# Patient Record
Sex: Female | Born: 1990 | Race: Black or African American | Hispanic: No | Marital: Single | Smoking: Never smoker
Health system: Southern US, Community
[De-identification: ages and names within clinical notes are randomized; demographics above are authoritative.]

## PROBLEM LIST (undated history)

## (undated) DIAGNOSIS — B009 Herpesviral infection, unspecified: Secondary | ICD-10-CM

## (undated) DIAGNOSIS — J45909 Unspecified asthma, uncomplicated: Secondary | ICD-10-CM

## (undated) DIAGNOSIS — G43909 Migraine, unspecified, not intractable, without status migrainosus: Secondary | ICD-10-CM

## (undated) DIAGNOSIS — F32A Depression, unspecified: Secondary | ICD-10-CM

## (undated) DIAGNOSIS — M549 Dorsalgia, unspecified: Secondary | ICD-10-CM

## (undated) DIAGNOSIS — T7840XA Allergy, unspecified, initial encounter: Secondary | ICD-10-CM

## (undated) DIAGNOSIS — A6000 Herpesviral infection of urogenital system, unspecified: Secondary | ICD-10-CM

## (undated) HISTORY — DX: Depression, unspecified: F32.A

## (undated) HISTORY — DX: Dorsalgia, unspecified: M54.9

## (undated) HISTORY — DX: Herpesviral infection of urogenital system, unspecified: A60.00

## (undated) HISTORY — DX: Migraine, unspecified, not intractable, without status migrainosus: G43.909

## (undated) HISTORY — DX: Herpesviral infection, unspecified: B00.9

## (undated) HISTORY — DX: Unspecified asthma, uncomplicated: J45.909

## (undated) HISTORY — DX: Allergy, unspecified, initial encounter: T78.40XA

---
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
## 2013-05-18 IMAGING — CR DG FOOT COMPLETE 3+V*L*
3 series · 3 of 3 positions shown · non-contrast
Comparison: None.

CLINICAL DATA: Lateral foot pain after injury.

LEFT FOOT - COMPLETE 3+ VIEW

[t foot ap left]
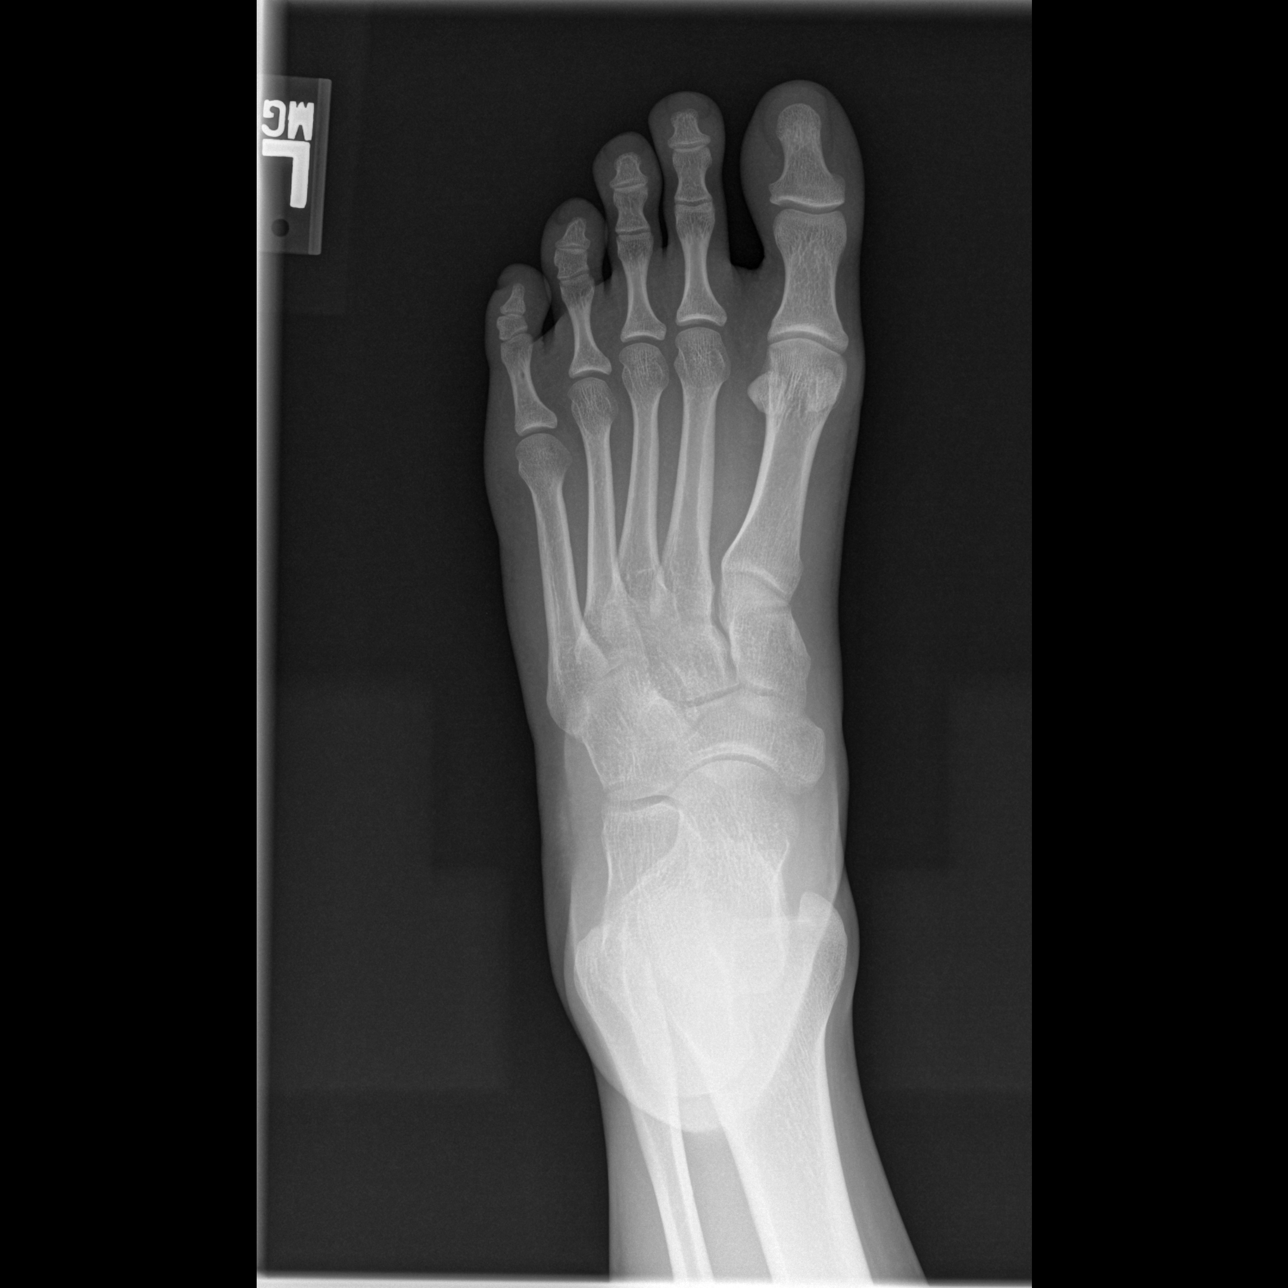

[t foot oblique left]
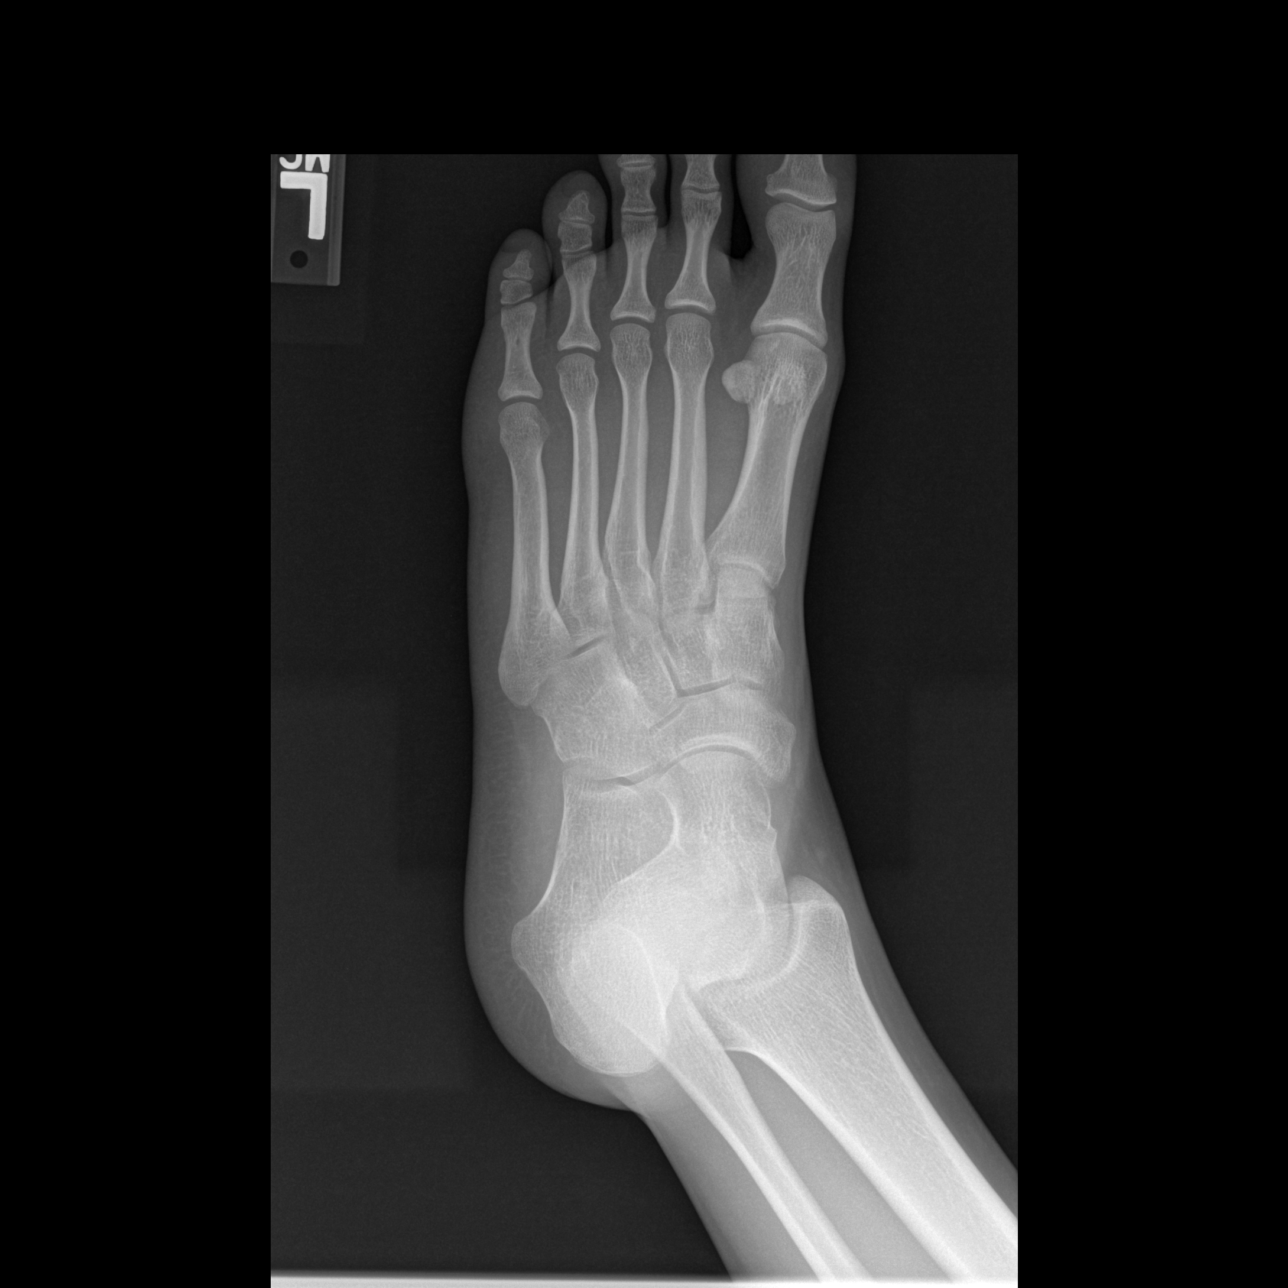

[t foot lat left]
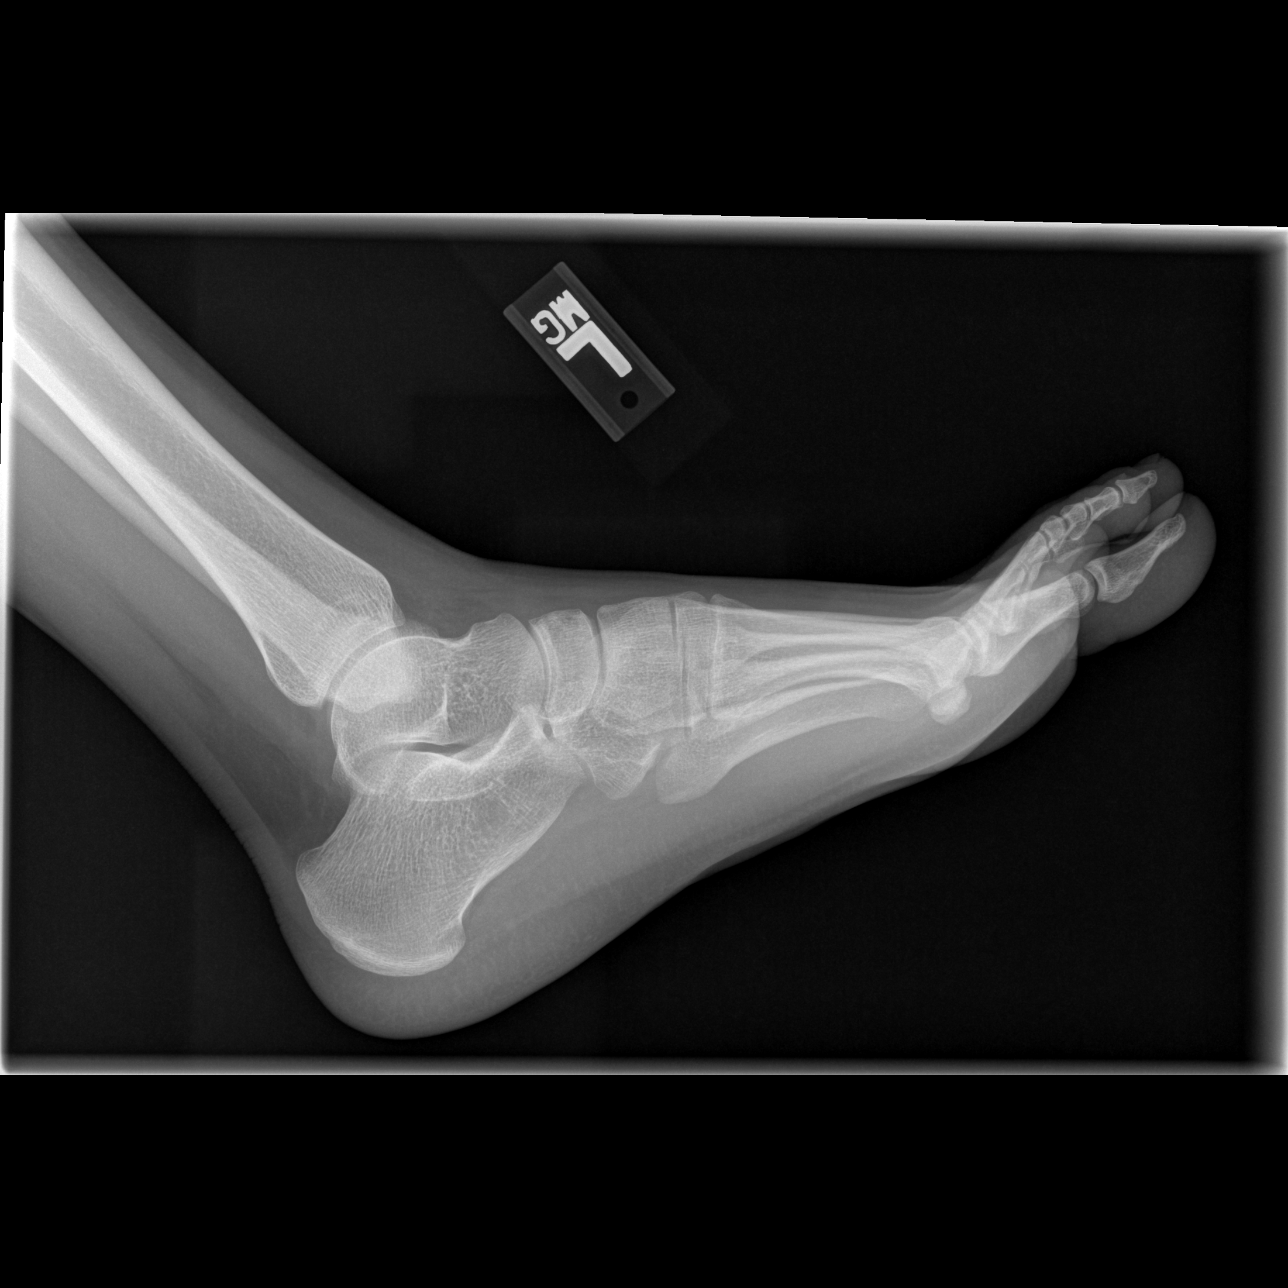

[3 of 3 positions shown; findings below may reference images not displayed]

FINDINGS: Imaged bones, joints and soft tissues appear normal.
IMPRESSION: Negative exam.

## 2013-05-18 IMAGING — CR DG ANKLE COMPLETE 3+V*L*
3 series · 3 of 3 positions shown · non-contrast
Comparison: None.

CLINICAL DATA: Left ankle injury with lateral pain.

LEFT ANKLE COMPLETE - 3+ VIEW

[t ankle joint ap left]
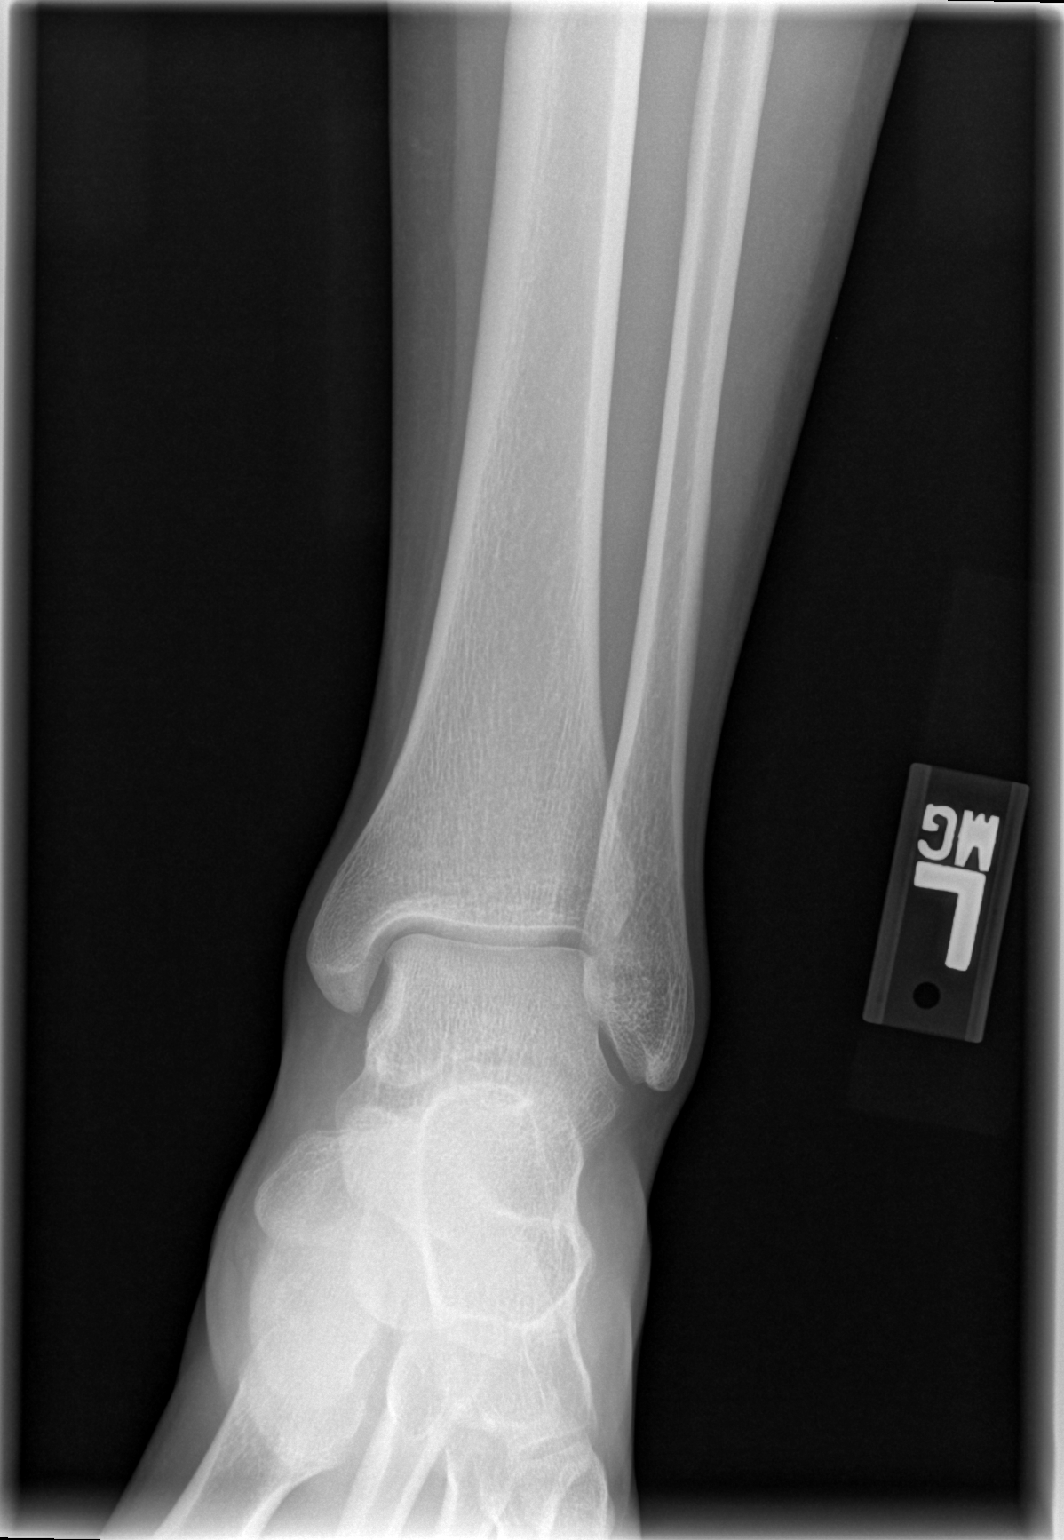

[t ankle joint oblique left]
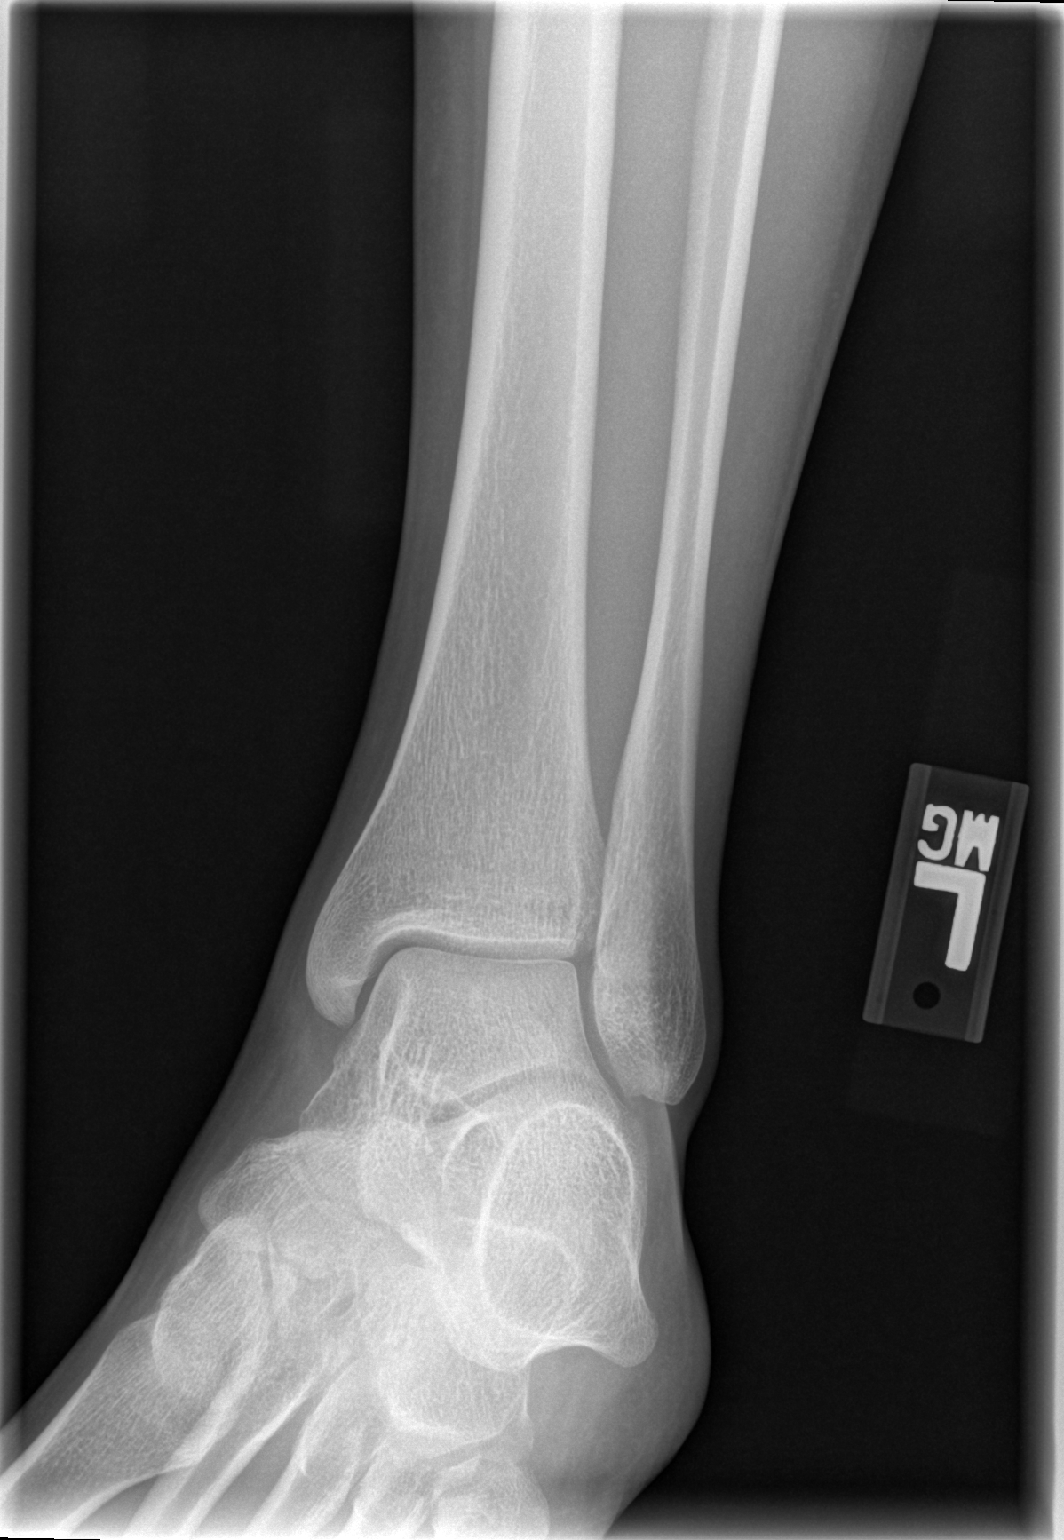

[t ankle joint lat left]
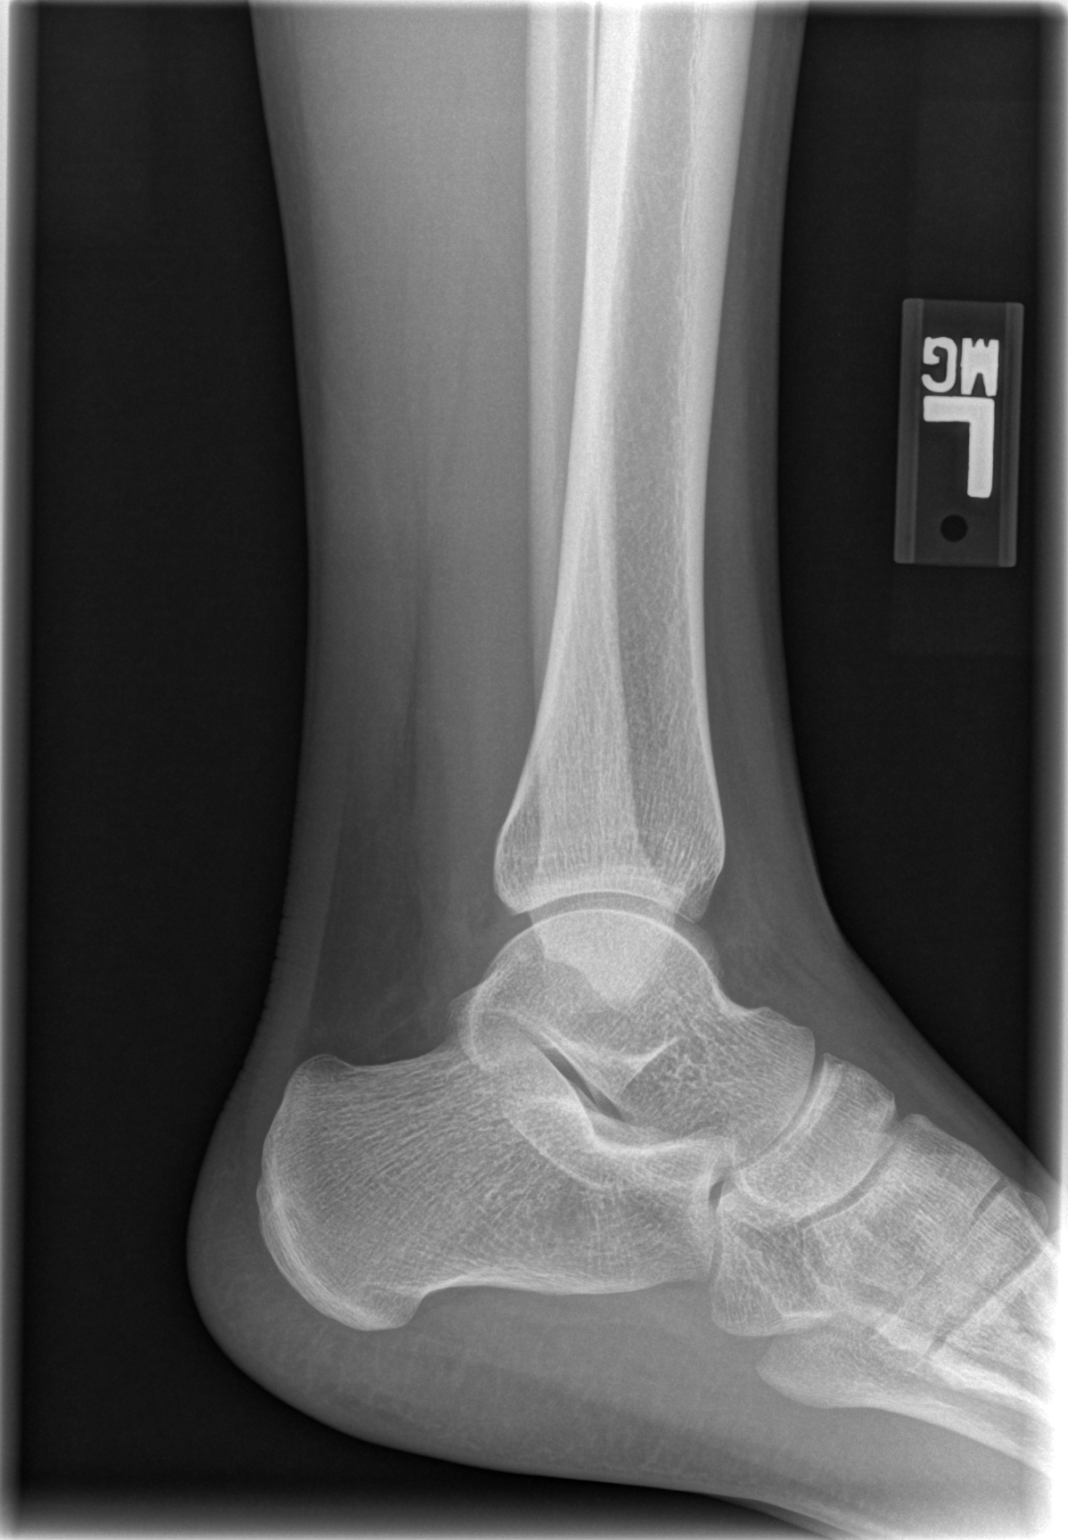

[3 of 3 positions shown; findings below may reference images not displayed]

FINDINGS: Imaged bones, joints and soft tissues appear normal.
IMPRESSION: Negative study.

## 2018-09-04 DIAGNOSIS — Z113 Encounter for screening for infections with a predominantly sexual mode of transmission: Secondary | ICD-10-CM | POA: Diagnosis not present

## 2018-09-04 DIAGNOSIS — Z01419 Encounter for gynecological examination (general) (routine) without abnormal findings: Secondary | ICD-10-CM | POA: Diagnosis not present

## 2018-09-04 LAB — HM PAP SMEAR: HM Pap smear: NEGATIVE

## 2019-06-05 DIAGNOSIS — Z20828 Contact with and (suspected) exposure to other viral communicable diseases: Secondary | ICD-10-CM | POA: Diagnosis not present

## 2019-07-12 DIAGNOSIS — N926 Irregular menstruation, unspecified: Secondary | ICD-10-CM | POA: Diagnosis not present

## 2019-07-12 DIAGNOSIS — L659 Nonscarring hair loss, unspecified: Secondary | ICD-10-CM | POA: Diagnosis not present

## 2019-07-12 DIAGNOSIS — Z23 Encounter for immunization: Secondary | ICD-10-CM | POA: Diagnosis not present

## 2019-07-12 DIAGNOSIS — M545 Low back pain: Secondary | ICD-10-CM | POA: Diagnosis not present

## 2019-07-12 DIAGNOSIS — Z8639 Personal history of other endocrine, nutritional and metabolic disease: Secondary | ICD-10-CM | POA: Diagnosis not present

## 2019-07-13 DIAGNOSIS — N926 Irregular menstruation, unspecified: Secondary | ICD-10-CM | POA: Diagnosis not present

## 2019-07-13 DIAGNOSIS — L659 Nonscarring hair loss, unspecified: Secondary | ICD-10-CM | POA: Diagnosis not present

## 2019-07-13 DIAGNOSIS — Z8639 Personal history of other endocrine, nutritional and metabolic disease: Secondary | ICD-10-CM | POA: Diagnosis not present

## 2019-07-19 DIAGNOSIS — Z8349 Family history of other endocrine, nutritional and metabolic diseases: Secondary | ICD-10-CM | POA: Diagnosis not present

## 2019-07-19 DIAGNOSIS — Z309 Encounter for contraceptive management, unspecified: Secondary | ICD-10-CM | POA: Diagnosis not present

## 2019-07-19 DIAGNOSIS — F411 Generalized anxiety disorder: Secondary | ICD-10-CM | POA: Diagnosis not present

## 2019-07-19 DIAGNOSIS — K5090 Crohn's disease, unspecified, without complications: Secondary | ICD-10-CM | POA: Diagnosis not present

## 2019-07-19 DIAGNOSIS — Z0000 Encounter for general adult medical examination without abnormal findings: Secondary | ICD-10-CM | POA: Diagnosis not present

## 2019-07-19 DIAGNOSIS — L905 Scar conditions and fibrosis of skin: Secondary | ICD-10-CM | POA: Diagnosis not present

## 2019-07-24 ENCOUNTER — Ambulatory Visit: Payer: Self-pay | Admitting: Family

## 2019-07-24 DIAGNOSIS — Z6820 Body mass index (BMI) 20.0-20.9, adult: Secondary | ICD-10-CM | POA: Diagnosis not present

## 2019-07-24 DIAGNOSIS — R5382 Chronic fatigue, unspecified: Secondary | ICD-10-CM | POA: Diagnosis not present

## 2019-07-24 DIAGNOSIS — K9049 Malabsorption due to intolerance, not elsewhere classified: Secondary | ICD-10-CM | POA: Diagnosis not present

## 2019-07-24 DIAGNOSIS — R198 Other specified symptoms and signs involving the digestive system and abdomen: Secondary | ICD-10-CM | POA: Diagnosis not present

## 2019-07-30 DIAGNOSIS — K9049 Malabsorption due to intolerance, not elsewhere classified: Secondary | ICD-10-CM | POA: Diagnosis not present

## 2019-08-02 ENCOUNTER — Other Ambulatory Visit: Payer: Self-pay | Admitting: Family Medicine

## 2019-08-02 DIAGNOSIS — M545 Low back pain, unspecified: Secondary | ICD-10-CM

## 2022-08-18 ENCOUNTER — Encounter: Payer: Self-pay | Admitting: Family Medicine

## 2022-08-18 ENCOUNTER — Ambulatory Visit: Payer: 59 | Admitting: Family Medicine

## 2022-08-18 ENCOUNTER — Ambulatory Visit (INDEPENDENT_AMBULATORY_CARE_PROVIDER_SITE_OTHER): Payer: 59

## 2022-08-18 VITALS — BP 100/62 | HR 94 | Temp 98.4°F | Ht 66.0 in | Wt 145.8 lb

## 2022-08-18 DIAGNOSIS — F902 Attention-deficit hyperactivity disorder, combined type: Secondary | ICD-10-CM | POA: Insufficient documentation

## 2022-08-18 DIAGNOSIS — M545 Low back pain, unspecified: Secondary | ICD-10-CM | POA: Diagnosis not present

## 2022-08-18 DIAGNOSIS — B009 Herpesviral infection, unspecified: Secondary | ICD-10-CM | POA: Diagnosis not present

## 2022-08-18 DIAGNOSIS — G8929 Other chronic pain: Secondary | ICD-10-CM | POA: Diagnosis not present

## 2022-08-18 DIAGNOSIS — Z3041 Encounter for surveillance of contraceptive pills: Secondary | ICD-10-CM

## 2022-08-18 DIAGNOSIS — F419 Anxiety disorder, unspecified: Secondary | ICD-10-CM | POA: Insufficient documentation

## 2022-08-18 DIAGNOSIS — F429 Obsessive-compulsive disorder, unspecified: Secondary | ICD-10-CM | POA: Insufficient documentation

## 2022-08-18 MED ORDER — LEVONORGESTREL-ETHINYL ESTRAD 0.1-20 MG-MCG PO TABS: 1.0000 | ORAL_TABLET | Freq: Every day | ORAL | 11 refills | Status: DC

## 2022-08-18 MED ORDER — ACYCLOVIR 400 MG PO TABS: 400.0000 mg | ORAL_TABLET | Freq: Two times a day (BID) | ORAL | 1 refills | Status: DC

## 2022-08-18 NOTE — Progress Notes (Signed)
New Patient Office Visit  Subjective    Patient ID: Gwendolyn Ward, female    DOB: Sep 24, 1990  Age: 31 y.o. MRN: [id number]  CC:  Chief Complaint  Patient presents with   Establish Care    HPI Gwendolyn K Dalton presents to establish care  Patient also reports chronic back pain that has been going on for some time, states that she is very active, a tennis player and she has had x-rays done in 2020, states that she was told that her x-rays were ok. She reports the pain is in the lower back, states that when it flares up she has difficulty walking due to the pain, gets very stiff also. States that a flare up will last about 2 days then it improves but usually it takes a week to improve. States that there is no radiation of the pain. States that her back feels "heavy" and she cannot straighten her legs fully.   We reviewed her medications this visit. She reports she see a psychiatrist regularly who prescribes her medications.   Pt reports she take her OCP's regularly due to heavy periods and history of anemia, states she is doing well on this medication,  Outpatient Encounter Medications as of 08/18/2022  Medication Sig   buPROPion (WELLBUTRIN XL) 150 MG 24 hr tablet Take 150 mg by mouth daily.   cyclobenzaprine (FLEXERIL) 5 MG tablet Take 5 mg by mouth 3 (three) times daily as needed for muscle spasms.   FLUoxetine (PROZAC) 20 MG capsule Take 20 mg by mouth daily.   Multiple Vitamin (MULTIVITAMIN) capsule Take 1 capsule by mouth daily.   naproxen (EC NAPROSYN) 500 MG EC tablet Take 500 mg by mouth as needed.   [DISCONTINUED] acyclovir (ZOVIRAX) 400 MG tablet Take 400 mg by mouth 3 (three) times daily.   [DISCONTINUED] levonorgestrel-ethinyl estradiol (ALESSE) 0.1-20 MG-MCG tablet Take 1 tablet by mouth daily.   acyclovir (ZOVIRAX) 400 MG tablet Take 1 tablet (400 mg total) by mouth 2 (two) times daily.   amphetamine-dextroamphetamine (ADDERALL XR) 20 MG 24 hr capsule Take 20 mg by  mouth daily.   levonorgestrel-ethinyl estradiol (ALESSE) 0.1-20 MG-MCG tablet Take 1 tablet by mouth daily.   No facility-administered encounter medications on file as of 08/18/2022.    Past Medical History:  Diagnosis Date   Allergy    Asthma    Back pain    Depression    Genital herpes simplex type 1 infection    Herpes simplex type 2 infection    Migraines     History reviewed. No pertinent surgical history.  Family History  Problem Relation Age of Onset   Hypertension Mother    Depression Mother    Mental illness Mother    Hypertension Father    Hyperlipidemia Father    Diabetes Father    Diabetes Maternal Grandmother    Mental illness Maternal Grandmother    Depression Maternal Grandmother    Arthritis Maternal Grandmother    Cancer Maternal Grandfather    Prostate cancer Maternal Grandfather    Prostate cancer Paternal Grandmother    Depression Paternal Grandmother    Alcohol abuse Paternal Grandmother    CVA Paternal Grandfather     Social History   Socioeconomic History   Marital status: Single    Spouse name: Not on file   Number of children: Not on file   Years of education: Not on file   Highest education level: Not on file  Occupational History   Not  on file  Tobacco Use   Smoking status: Never   Smokeless tobacco: Never  Vaping Use   Vaping Use: Never used  Substance and Sexual Activity   Alcohol use: Yes   Drug use: Never   Sexual activity: Not Currently  Other Topics Concern   Not on file  Social History Narrative   Not on file   Social Determinants of Health   Financial Resource Strain: Not on file  Food Insecurity: Not on file  Transportation Needs: Not on file  Physical Activity: Not on file  Stress: Not on file  Social Connections: Not on file  Intimate Partner Violence: Not on file    Review of Systems  All other systems reviewed and are negative.       Objective    BP 100/62 (BP Location: Left Arm, Patient  Position: Sitting, Cuff Size: Normal)   Pulse 94   Temp 98.4 F (36.9 C) (Oral)   Ht 5\' 6"  (1.676 m)   Wt 145 lb 12.8 oz (66.1 kg)   SpO2 100%   BMI 23.53 kg/m   Physical Exam Vitals reviewed.  Constitutional:      Appearance: Normal appearance. She is well-groomed and normal weight.  Eyes:     Conjunctiva/sclera: Conjunctivae normal.  Neck:     Thyroid: No thyromegaly.  Cardiovascular:     Rate and Rhythm: Normal rate and regular rhythm.     Pulses: Normal pulses.     Heart sounds: S1 normal and S2 normal.  Pulmonary:     Effort: Pulmonary effort is normal.     Breath sounds: Normal breath sounds and air entry.  Abdominal:     General: Bowel sounds are normal.  Musculoskeletal:     Right lower leg: No edema.     Left lower leg: No edema.  Neurological:     Mental Status: She is alert and oriented to person, place, and time. Mental status is at baseline.     Gait: Gait is intact.  Psychiatric:        Mood and Affect: Mood and affect normal.        Speech: Speech normal.        Behavior: Behavior normal.        Judgment: Judgment normal.         Assessment & Plan:   Problem List Items Addressed This Visit       Unprioritized   Chronic low back pain without sciatica - Primary (Chronic)    I advised continuing the naproxen 500 mg BID PRN and the cyclobenzaprine 5 mg three times daily that she was given by the urgent care provider. I will order lumbar x-rays and we discussed referral to sports medicine and she is agreeable.      Relevant Medications   FLUoxetine (PROZAC) 20 MG capsule   buPROPion (WELLBUTRIN XL) 150 MG 24 hr tablet   naproxen (EC NAPROSYN) 500 MG EC tablet   cyclobenzaprine (FLEXERIL) 5 MG tablet   Other Relevant Orders   DG Lumbar Spine Complete   Ambulatory referral to Sports Medicine   Other Visit Diagnoses     Herpes simplex infection       Relevant Medications   Pt has history of this, would like to take the acyclovir daily as a  suppression medication, will send 400 mg BID.   acyclovir (ZOVIRAX) 400 MG tablet   Oral contraceptive use       Relevant Medications   Doing well on this  OCP, will refill this for her.  levonorgestrel-ethinyl estradiol (ALESSE) 0.1-20 MG-MCG tablet       No follow-ups on file.   Karie Georges, MD

## 2022-08-18 NOTE — Assessment & Plan Note (Signed)
I advised continuing the naproxen 500 mg BID PRN and the cyclobenzaprine 5 mg three times daily that she was given by the urgent care provider. I will order lumbar x-rays and we discussed referral to sports medicine and she is agreeable.

## 2022-08-24 ENCOUNTER — Encounter: Payer: Self-pay | Admitting: Family Medicine

## 2022-08-24 ENCOUNTER — Ambulatory Visit (INDEPENDENT_AMBULATORY_CARE_PROVIDER_SITE_OTHER): Payer: 59 | Admitting: Family Medicine

## 2022-08-24 VITALS — BP 94/60 | HR 97 | Temp 99.1°F | Ht 66.5 in | Wt 142.8 lb

## 2022-08-24 DIAGNOSIS — Z0000 Encounter for general adult medical examination without abnormal findings: Secondary | ICD-10-CM | POA: Diagnosis not present

## 2022-08-24 DIAGNOSIS — E611 Iron deficiency: Secondary | ICD-10-CM

## 2022-08-24 DIAGNOSIS — Z1159 Encounter for screening for other viral diseases: Secondary | ICD-10-CM

## 2022-08-24 DIAGNOSIS — Z114 Encounter for screening for human immunodeficiency virus [HIV]: Secondary | ICD-10-CM

## 2022-08-24 LAB — COMPREHENSIVE METABOLIC PANEL
ALT: 14 U/L (ref 0–35)
AST: 23 U/L (ref 0–37)
Albumin: 4.2 g/dL (ref 3.5–5.2)
Alkaline Phosphatase: 77 U/L (ref 39–117)
BUN: 10 mg/dL (ref 6–23)
CO2: 23 mEq/L (ref 19–32)
Calcium: 9.5 mg/dL (ref 8.4–10.5)
Chloride: 104 mEq/L (ref 96–112)
Creatinine, Ser: 0.86 mg/dL (ref 0.40–1.20)
GFR: 91.49 mL/min (ref >60.00)
Glucose, Bld: 90 mg/dL (ref 70–99)
Potassium: 4 mEq/L (ref 3.5–5.1)
Sodium: 136 mEq/L (ref 135–145)
Total Bilirubin: 0.6 mg/dL (ref 0.2–1.2)
Total Protein: 8.1 g/dL (ref 6.0–8.3)

## 2022-08-24 LAB — LIPID PANEL
Cholesterol: 166 mg/dL (ref 0–200)
HDL: 55 mg/dL (ref >39.00)
LDL Cholesterol: 79 mg/dL (ref 0–99)
NonHDL: 110.69
Total CHOL/HDL Ratio: 3
Triglycerides: 156 mg/dL — ABNORMAL HIGH (ref 0.0–149.0)
VLDL: 31.2 mg/dL (ref 0.0–40.0)

## 2022-08-24 NOTE — Progress Notes (Signed)
Complete physical exam  Patient: Gwendolyn Ward   DOB: May 27, 1991   31 y.o. Female  MRN: [id number]  Subjective:    Chief Complaint  Patient presents with   Annual Exam    Gwendolyn Ward is a 31 y.o. female who presents today for a complete physical exam. She reports consuming a  gluten free  diet. Home exercise routine includes plays tennis. She generally feels well. She reports sleeping well. She does not have additional problems to discuss today.    Most recent fall risk assessment:     No data to display           Most recent depression screenings:    08/24/2022   11:20 AM  PHQ 2/9 Scores  PHQ - 2 Score 1  PHQ- 9 Score 2    Dental: No current dental problems and Receives regular dental care  Patient Active Problem List   Diagnosis Date Noted   Iron deficiency 08/24/2022   Chronic low back pain without sciatica 08/18/2022   ADHD (attention deficit hyperactivity disorder), combined type 08/18/2022   OCD (obsessive compulsive disorder) 08/18/2022   Anxiety 08/18/2022      Patient Care Team: Karie Georges, MD as PCP - General (Family Medicine)   Outpatient Medications Prior to Visit  Medication Sig   acyclovir (ZOVIRAX) 400 MG tablet Take 1 tablet (400 mg total) by mouth 2 (two) times daily.   amphetamine-dextroamphetamine (ADDERALL XR) 20 MG 24 hr capsule Take 20 mg by mouth daily.   buPROPion (WELLBUTRIN XL) 150 MG 24 hr tablet Take 150 mg by mouth daily.   cyclobenzaprine (FLEXERIL) 5 MG tablet Take 5 mg by mouth 3 (three) times daily as needed for muscle spasms.   FLUoxetine (PROZAC) 20 MG capsule Take 20 mg by mouth daily.   levonorgestrel-ethinyl estradiol (ALESSE) 0.1-20 MG-MCG tablet Take 1 tablet by mouth daily.   Multiple Vitamin (MULTIVITAMIN) capsule Take 1 capsule by mouth daily.   naproxen (EC NAPROSYN) 500 MG EC tablet Take 500 mg by mouth as needed.   No facility-administered medications prior to visit.    Review of Systems  All  other systems reviewed and are negative.         Objective:     BP 94/60 (BP Location: Left Arm, Patient Position: Sitting, Cuff Size: Normal)   Pulse 97   Temp 99.1 F (37.3 C) (Oral)   Ht 5' 6.5" (1.689 m)   Wt 142 lb 12.8 oz (64.8 kg)   SpO2 96%   BMI 22.70 kg/m    Physical Exam Vitals reviewed.  Constitutional:      Appearance: Normal appearance. She is well-groomed and normal weight.  HENT:     Head: Normocephalic and atraumatic.     Right Ear: Tympanic membrane and ear canal normal.     Left Ear: Tympanic membrane and ear canal normal.     Mouth/Throat:     Mouth: Mucous membranes are moist.     Pharynx: Oropharynx is clear.  Eyes:     Conjunctiva/sclera: Conjunctivae normal.  Cardiovascular:     Rate and Rhythm: Normal rate and regular rhythm.     Pulses: Normal pulses.     Heart sounds: S1 normal and S2 normal.  Pulmonary:     Effort: Pulmonary effort is normal.     Breath sounds: Normal breath sounds and air entry.  Abdominal:     General: Abdomen is flat. Bowel sounds are normal.     Palpations:  Abdomen is soft.  Musculoskeletal:        General: Normal range of motion.     Cervical back: Normal range of motion and neck supple.     Right lower leg: No edema.     Left lower leg: No edema.  Skin:    General: Skin is warm and dry.  Neurological:     Mental Status: She is alert and oriented to person, place, and time. Mental status is at baseline.     Gait: Gait is intact.  Psychiatric:        Mood and Affect: Mood and affect normal.        Speech: Speech normal.        Behavior: Behavior normal.        Judgment: Judgment normal.      No results found for any visits on 08/24/22.     Assessment & Plan:    Routine Health Maintenance and Physical Exam  Immunization History  Administered Date(s) Administered   Influenza-Unspecified 05/30/2022   Tdap 08/18/2017    Health Maintenance  Topic Date Due   PAP-Cervical Cytology Screening  Never  done   Hepatitis C Screening  08/19/2023 (Originally 07/15/2011)   PAP SMEAR-Modifier  08/19/2023   DTaP/Tdap/Td (2 - Td or Tdap) 08/19/2027   INFLUENZA VACCINE  Completed   HIV Screening  Completed   HPV VACCINES  Aged Out    Discussed health benefits of physical activity, and encouraged her to engage in regular exercise appropriate for her age and condition.  Problem List Items Addressed This Visit       Unprioritized   Iron deficiency (Chronic)    History of in the past, pt is on OCP's to help reduce bleeding from her periods, will recheck iron panel today.      Relevant Orders   Iron, TIBC and Ferritin Panel   Other Visit Diagnoses     Healthy adult on routine physical examination    -  Primary   Relevant Orders   Normal physical exam findings today, I filled out paperwork for her today also documenting her annual physical. Will order CMP and lipid panel today.  CMP   Lipid Panel   Encounter for hepatitis C screening test for low risk patient       Relevant Orders   Hep C Antibody   Encounter for screening for HIV       Relevant Orders   HIV antibody (with reflex)      Return in about 1 year (around 08/25/2023) for annual physical exam.     Karie Georges, MD

## 2022-08-24 NOTE — Assessment & Plan Note (Signed)
History of in the past, pt is on OCP's to help reduce bleeding from her periods, will recheck iron panel today.

## 2022-08-25 LAB — HEPATITIS C ANTIBODY: Hepatitis C Ab: NONREACTIVE

## 2022-08-25 LAB — IRON,TIBC AND FERRITIN PANEL
%SAT: 40 % (calc) (ref 16–45)
Ferritin: 15 ng/mL — ABNORMAL LOW (ref 16–154)
Iron: 192 ug/dL — ABNORMAL HIGH (ref 40–190)
TIBC: 481 mcg/dL (calc) — ABNORMAL HIGH (ref 250–450)

## 2022-08-25 LAB — HIV ANTIBODY (ROUTINE TESTING W REFLEX): HIV 1&2 Ab, 4th Generation: NONREACTIVE

## 2022-09-08 ENCOUNTER — Ambulatory Visit: Payer: 59 | Admitting: Sports Medicine

## 2022-09-16 ENCOUNTER — Ambulatory Visit: Payer: 59 | Admitting: Sports Medicine

## 2022-09-16 NOTE — Progress Notes (Deleted)
Gwendolyn Ward D.Gwendolyn Ward: [phone]   Assessment and Plan:     There are no diagnoses linked to this encounter.  ***   Pertinent previous records reviewed include ***   Follow Up: ***     Subjective:   I, Gwendolyn Ward, am serving as a Education administrator for Gwendolyn Ward  Chief Complaint: low back pain   HPI:   09/16/22 Patient is a 32 year old female complaining of low back pain. Patient states  Relevant Historical Information: ***  Additional pertinent review of systems negative.   Current Outpatient Medications:    acyclovir (ZOVIRAX) 400 MG tablet, Take 1 tablet (400 mg total) by mouth 2 (two) times daily., Disp: 180 tablet, Rfl: 1   amphetamine-dextroamphetamine (ADDERALL XR) 20 MG 24 hr capsule, Take 20 mg by mouth daily., Disp: , Rfl:    buPROPion (WELLBUTRIN XL) 150 MG 24 hr tablet, Take 150 mg by mouth daily., Disp: , Rfl:    cyclobenzaprine (FLEXERIL) 5 MG tablet, Take 5 mg by mouth 3 (three) times daily as needed for muscle spasms., Disp: , Rfl:    FLUoxetine (PROZAC) 20 MG capsule, Take 20 mg by mouth daily., Disp: , Rfl:    levonorgestrel-ethinyl estradiol (ALESSE) 0.1-20 MG-MCG tablet, Take 1 tablet by mouth daily., Disp: 28 tablet, Rfl: 11   Multiple Vitamin (MULTIVITAMIN) capsule, Take 1 capsule by mouth daily., Disp: , Rfl:    naproxen (EC NAPROSYN) 500 MG EC tablet, Take 500 mg by mouth as needed., Disp: , Rfl:    Objective:     There were no vitals filed for this visit.    There is no height or weight on file to calculate BMI.    Physical Exam:    ***   Electronically signed by:  Gwendolyn Ward D.G[facility]ol[facility] 7:42 AM 09/16/22

## 2022-10-04 ENCOUNTER — Encounter: Payer: Self-pay | Admitting: Internal Medicine

## 2022-10-04 ENCOUNTER — Ambulatory Visit: Payer: 59 | Admitting: Internal Medicine

## 2022-10-04 VITALS — BP 110/80 | HR 101 | Temp 99.0°F | Wt 140.8 lb

## 2022-10-04 DIAGNOSIS — J069 Acute upper respiratory infection, unspecified: Secondary | ICD-10-CM | POA: Diagnosis not present

## 2022-10-04 DIAGNOSIS — R52 Pain, unspecified: Secondary | ICD-10-CM

## 2022-10-04 DIAGNOSIS — R509 Fever, unspecified: Secondary | ICD-10-CM | POA: Diagnosis not present

## 2022-10-04 DIAGNOSIS — J029 Acute pharyngitis, unspecified: Secondary | ICD-10-CM

## 2022-10-04 LAB — POCT INFLUENZA A/B
Influenza A, POC: NEGATIVE
Influenza B, POC: NEGATIVE

## 2022-10-04 LAB — POCT RAPID STREP A (OFFICE): Rapid Strep A Screen: NEGATIVE

## 2022-10-04 LAB — POC COVID19 BINAXNOW: SARS Coronavirus 2 Ag: NEGATIVE

## 2022-10-04 NOTE — Progress Notes (Signed)
Established Patient Office Visit     CC/Reason for Visit: URI symptoms  HPI: Gwendolyn Ward is a 32 y.o. female who is coming in today for the above mentioned reasons.  Last week she traveled on a work trip.  2 days ago she started having sore throat, body aches, fever of 100.5.  She has been taking DayQuil.  Mother was diagnosed with COVID last week.   Past Medical/Surgical History: Past Medical History:  Diagnosis Date   Allergy    Asthma    Back pain    Depression    Genital herpes simplex type 1 infection    Herpes simplex type 2 infection    Migraines     No past surgical history on file.  Social History:  reports that she has never smoked. She has never used smokeless tobacco. She reports current alcohol use. She reports that she does not use drugs.  Allergies: No Known Allergies  Family History:  Family History  Problem Relation Age of Onset   Hypertension Mother    Depression Mother    Mental illness Mother    Hypertension Father    Hyperlipidemia Father    Diabetes Father    Diabetes Maternal Grandmother    Mental illness Maternal Grandmother    Depression Maternal Grandmother    Arthritis Maternal Grandmother    Cancer Maternal Grandfather    Prostate cancer Maternal Grandfather    Prostate cancer Paternal Grandmother    Depression Paternal Grandmother    Alcohol abuse Paternal Grandmother    CVA Paternal Grandfather      Current Outpatient Medications:    acyclovir (ZOVIRAX) 400 MG tablet, Take 1 tablet (400 mg total) by mouth 2 (two) times daily., Disp: 180 tablet, Rfl: 1   amphetamine-dextroamphetamine (ADDERALL XR) 20 MG 24 hr capsule, Take 20 mg by mouth daily., Disp: , Rfl:    buPROPion (WELLBUTRIN XL) 150 MG 24 hr tablet, Take 150 mg by mouth daily., Disp: , Rfl:    cyclobenzaprine (FLEXERIL) 5 MG tablet, Take 5 mg by mouth 3 (three) times daily as needed for muscle spasms., Disp: , Rfl:    FLUoxetine (PROZAC) 20 MG capsule,  Take 20 mg by mouth daily., Disp: , Rfl:    levonorgestrel-ethinyl estradiol (ALESSE) 0.1-20 MG-MCG tablet, Take 1 tablet by mouth daily., Disp: 28 tablet, Rfl: 11   Multiple Vitamin (MULTIVITAMIN) capsule, Take 1 capsule by mouth daily., Disp: , Rfl:    naproxen (EC NAPROSYN) 500 MG EC tablet, Take 500 mg by mouth as needed., Disp: , Rfl:   Review of Systems:  Negative unless indicated in HPI.   Physical Exam: Vitals:   10/04/22 1501  BP: 110/80  Pulse: (!) 101  Temp: 99 F (37.2 C)  TempSrc: Oral  SpO2: 98%  Weight: 140 lb 12.8 oz (63.9 kg)    Body mass index is 22.39 kg/m.   Physical Exam Vitals reviewed.  Constitutional:      Appearance: Normal appearance.  HENT:     Right Ear: Tympanic membrane, ear canal and external ear normal.     Left Ear: Tympanic membrane, ear canal and external ear normal.     Mouth/Throat:     Mouth: Mucous membranes are moist.     Pharynx: Oropharynx is clear.  Eyes:     Conjunctiva/sclera: Conjunctivae normal.     Pupils: Pupils are equal, round, and reactive to light.  Cardiovascular:     Rate and Rhythm: Normal rate and regular  rhythm.  Pulmonary:     Effort: Pulmonary effort is normal.     Breath sounds: Normal breath sounds.  Neurological:     Mental Status: She is alert.      Impression and Plan:  Viral upper respiratory tract infection  Fever, unspecified fever cause - Plan: POC COVID-19 BinaxNow, POCT Influenza A/B, POCT rapid strep A  Body aches - Plan: POC COVID-19 BinaxNow, POCT Influenza A/B, POCT rapid strep A  Sore throat - Plan: POC COVID-19 BinaxNow, POCT Influenza A/B, POCT rapid strep A  -In office flu, COVID, rapid strep tests are all negative.  Advised that it may be too soon to test and have advised that she redo COVID test in 1 to 2 days especially given close positive contact. -Given exam findings, PNA, pharyngitis, ear infection are not likely, hence abx have not been prescribed. -Have advised rest,  fluids, OTC antihistamines, cough suppressants and mucinex. -RTC if no improvement in 10-14 days.  Time spent:22 minutes reviewing chart, interviewing and examining patient and formulating plan of care.     Lelon Frohlich, MD [facility]

## 2022-10-05 ENCOUNTER — Telehealth: Payer: Self-pay | Admitting: Family Medicine

## 2022-10-05 ENCOUNTER — Other Ambulatory Visit: Payer: Self-pay | Admitting: Internal Medicine

## 2022-10-05 DIAGNOSIS — U071 COVID-19: Secondary | ICD-10-CM

## 2022-10-05 MED ORDER — NIRMATRELVIR/RITONAVIR (PAXLOVID)TABLET: 3.0000 | ORAL_TABLET | Freq: Two times a day (BID) | ORAL | 0 refills | Status: AC

## 2022-10-05 NOTE — Telephone Encounter (Signed)
Pt saw dr Jerilee Hoh yesterday for uri and she just tested positive for covid and would like to know if md is going to  call in some medication to  [facility] [id number] - [place], [place] - [address] Phone: [phone]  Fax: [phone]

## 2022-10-05 NOTE — Telephone Encounter (Signed)
Patient is aware 

## 2022-10-06 NOTE — Telephone Encounter (Signed)
Left message on machine for patient to return our call 

## 2022-10-07 NOTE — Telephone Encounter (Signed)
Patient is aware 

## 2022-10-07 NOTE — Telephone Encounter (Signed)
Left message on machine for patient to return our call 

## 2022-10-14 ENCOUNTER — Ambulatory Visit: Payer: 59 | Admitting: Sports Medicine

## 2023-01-11 ENCOUNTER — Telehealth: Payer: Self-pay | Admitting: Family Medicine

## 2023-01-11 NOTE — Telephone Encounter (Signed)
Spoke with Ladona Ridgel at [facility] as PCP sent in a years supply on 08/18/2022.  Patient was informed Ladona Ridgel sent the Rx through the system and stated the patient's Rx should be ready for pick up tomorrow or the automated system will let her know.

## 2023-01-11 NOTE — Telephone Encounter (Signed)
Patient states she takes her levonorgestrel-ethinyl estradiol (ALESSE) 0.1-20 MG-MCG tablet continuously, without the normal 1 week break, has run out of the medication and needs a refill

## 2023-02-01 ENCOUNTER — Ambulatory Visit (HOSPITAL_COMMUNITY)
Admission: EM | Admit: 2023-02-01 | Discharge: 2023-02-01 | Disposition: A | Discharge status: Home / Self Care | Payer: 59

## 2023-02-01 ENCOUNTER — Other Ambulatory Visit: Payer: Self-pay

## 2023-02-01 ENCOUNTER — Ambulatory Visit (INDEPENDENT_AMBULATORY_CARE_PROVIDER_SITE_OTHER): Payer: 59

## 2023-02-01 ENCOUNTER — Encounter (HOSPITAL_COMMUNITY): Payer: Self-pay | Admitting: *Deleted

## 2023-02-01 DIAGNOSIS — S161XXA Strain of muscle, fascia and tendon at neck level, initial encounter: Secondary | ICD-10-CM

## 2023-02-01 DIAGNOSIS — M25552 Pain in left hip: Secondary | ICD-10-CM | POA: Diagnosis not present

## 2023-02-01 DIAGNOSIS — M25562 Pain in left knee: Secondary | ICD-10-CM

## 2023-02-01 MED ORDER — IBUPROFEN 800 MG PO TABS
ORAL_TABLET | ORAL | Status: AC
  Filled 2023-02-01: qty 1

## 2023-02-01 MED ORDER — METAXALONE 800 MG PO TABS: 800.0000 mg | ORAL_TABLET | Freq: Three times a day (TID) | ORAL | 0 refills | Status: DC

## 2023-02-01 MED ORDER — DICLOFENAC SODIUM 75 MG PO TBEC: 75.0000 mg | DELAYED_RELEASE_TABLET | Freq: Two times a day (BID) | ORAL | 0 refills | Status: AC

## 2023-02-01 MED ORDER — IBUPROFEN 800 MG PO TABS
800.0000 mg | ORAL_TABLET | Freq: Once | ORAL | Status: AC
  Administered 2023-02-01: 800 mg via ORAL
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
## 2023-02-01 NOTE — Discharge Instructions (Signed)
Your x-ray does not show any acute abnormalities concerning for fracture or dislocation. Your spine does look abnormally straight, which is usually secondary to muscle spasm and tension.  I would recommend you start taking metaxalone, a muscle relaxer, 3 times daily.  This medication may make you feel tired or drowsy, so do not operate machinery or drive a car after taking.  Take the diclofenac, and anti-inflammatory medication, twice daily.  Take this with a meal.  Please do not take any additional over-the-counter anti-inflammatory medication such as Advil, Aleve, naproxen, Motrin, ibuprofen.  I would recommend getting a heating pack and applying this to your neck and shoulder several times daily. Also consider using a warm Epsom salt bath to relax the muscles.  She develop any new or worsening symptoms, please return to clinic for recheck or head to the emergency room.  Please read the attached handout on cervical strain and rehab exercises.

## 2023-02-22 ENCOUNTER — Encounter: Payer: Self-pay | Admitting: Family Medicine

## 2023-02-22 ENCOUNTER — Ambulatory Visit (INDEPENDENT_AMBULATORY_CARE_PROVIDER_SITE_OTHER): Payer: 59 | Admitting: Family Medicine

## 2023-02-22 VITALS — BP 110/82 | HR 102 | Temp 98.7°F | Ht 66.5 in | Wt 144.9 lb

## 2023-02-22 DIAGNOSIS — G4486 Cervicogenic headache: Secondary | ICD-10-CM

## 2023-02-22 DIAGNOSIS — S134XXD Sprain of ligaments of cervical spine, subsequent encounter: Secondary | ICD-10-CM

## 2023-02-22 MED ORDER — METAXALONE 800 MG PO TABS: 800.0000 mg | ORAL_TABLET | Freq: Three times a day (TID) | ORAL | 0 refills | Status: DC

## 2023-02-22 MED ORDER — DICLOFENAC SODIUM 75 MG PO TBEC: 75.0000 mg | DELAYED_RELEASE_TABLET | Freq: Two times a day (BID) | ORAL | 0 refills | Status: AC
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
## 2023-08-15 ENCOUNTER — Encounter: Payer: Self-pay | Admitting: Family Medicine

## 2023-08-15 ENCOUNTER — Telehealth: Payer: Self-pay | Admitting: Family Medicine

## 2023-08-15 ENCOUNTER — Ambulatory Visit (INDEPENDENT_AMBULATORY_CARE_PROVIDER_SITE_OTHER): Payer: 59 | Admitting: Family Medicine

## 2023-08-15 VITALS — BP 90/68 | HR 88 | Temp 99.1°F | Ht 68.5 in | Wt 146.9 lb

## 2023-08-15 DIAGNOSIS — E611 Iron deficiency: Secondary | ICD-10-CM | POA: Diagnosis not present

## 2023-08-15 DIAGNOSIS — Z0000 Encounter for general adult medical examination without abnormal findings: Secondary | ICD-10-CM

## 2023-08-15 DIAGNOSIS — Z23 Encounter for immunization: Secondary | ICD-10-CM | POA: Diagnosis not present

## 2023-08-15 LAB — CBC WITH DIFFERENTIAL/PLATELET
Basophils Absolute: 0 10*3/uL (ref 0.0–0.1)
Basophils Relative: 0.5 % (ref 0.0–3.0)
Eosinophils Absolute: 0.1 10*3/uL (ref 0.0–0.7)
Eosinophils Relative: 1 % (ref 0.0–5.0)
HCT: 40.4 % (ref 36.0–46.0)
Hemoglobin: 13.1 g/dL (ref 12.0–15.0)
Lymphocytes Relative: 27.9 % (ref 12.0–46.0)
Lymphs Abs: 1.4 10*3/uL (ref 0.7–4.0)
MCHC: 32.5 g/dL (ref 30.0–36.0)
MCV: 84.4 fL (ref 78.0–100.0)
Monocytes Absolute: 0.3 10*3/uL (ref 0.1–1.0)
Monocytes Relative: 6.4 % (ref 3.0–12.0)
Neutro Abs: 3.3 10*3/uL (ref 1.4–7.7)
Neutrophils Relative %: 64.2 % (ref 43.0–77.0)
Platelets: 333 10*3/uL (ref 150.0–400.0)
RBC: 4.78 Mil/uL (ref 3.87–5.11)
RDW: 13.9 % (ref 11.5–15.5)
WBC: 5.1 10*3/uL (ref 4.0–10.5)

## 2023-08-15 LAB — COMPREHENSIVE METABOLIC PANEL
ALT: 13 U/L (ref 0–35)
AST: 23 U/L (ref 0–37)
Albumin: 4.2 g/dL (ref 3.5–5.2)
Alkaline Phosphatase: 111 U/L (ref 39–117)
BUN: 10 mg/dL (ref 6–23)
CO2: 26 meq/L (ref 19–32)
Calcium: 9.2 mg/dL (ref 8.4–10.5)
Chloride: 107 meq/L (ref 96–112)
Creatinine, Ser: 0.96 mg/dL (ref 0.40–1.20)
GFR: 79.63 mL/min (ref >60.00)
Glucose, Bld: 88 mg/dL (ref 70–99)
Potassium: 4 meq/L (ref 3.5–5.1)
Sodium: 140 meq/L (ref 135–145)
Total Bilirubin: 0.4 mg/dL (ref 0.2–1.2)
Total Protein: 7.4 g/dL (ref 6.0–8.3)

## 2023-08-15 NOTE — Patient Instructions (Signed)

## 2023-08-15 NOTE — Progress Notes (Signed)
Complete physical exam  Patient: Gwendolyn Ward   DOB: 12/21/1990   32 y.o. Female  MRN: [id number]  Subjective:    Chief Complaint  Patient presents with   Annual Exam    Gwendolyn Ward is a 32 y.o. female who presents today for a complete physical exam. She reports consuming a  usually gluten free  diet.  Plays tennis 3-4 times per week  She generally feels well. She reports sleeping well. She does not have additional problems to discuss today.    Most recent fall risk assessment:    10/04/2022    3:10 PM  Fall Risk   Falls in the past year? 0  Number falls in past yr: 0  Injury with Fall? 0  Risk for fall due to : No Fall Risks  Follow up Falls evaluation completed     Most recent depression screenings:    02/22/2023    1:23 PM 10/04/2022    3:11 PM  PHQ 2/9 Scores  PHQ - 2 Score 2 0  PHQ- 9 Score 8 1    Vision:Within last year and wears glasses and contacts and Dental: No current dental problems and Receives regular dental care-- pt reports that she had 2 wisdom teeth removed and also needs a root canal soon.  Patient Active Problem List   Diagnosis Date Noted   Iron deficiency 08/24/2022   Chronic low back pain without sciatica 08/18/2022   ADHD (attention deficit hyperactivity disorder), combined type 08/18/2022   OCD (obsessive compulsive disorder) 08/18/2022   Anxiety 08/18/2022      Patient Care Team: Karie Georges, MD as PCP - General (Family Medicine)   Outpatient Medications Prior to Visit  Medication Sig   acyclovir (ZOVIRAX) 400 MG tablet Take 1 tablet (400 mg total) by mouth 2 (two) times daily.   amphetamine-dextroamphetamine (ADDERALL XR) 20 MG 24 hr capsule Take 20 mg by mouth daily.   buPROPion HCl (WELLBUTRIN PO) Take by mouth as needed.   cyclobenzaprine (FLEXERIL) 5 MG tablet Take 5 mg by mouth 3 (three) times daily as needed for muscle spasms.   diclofenac (VOLTAREN) 75 MG EC tablet Take 1 tablet (75 mg total) by mouth  2 (two) times daily.   lamoTRIgine (LAMICTAL) 150 MG tablet Take 150 mg by mouth daily.   levonorgestrel-ethinyl estradiol (ALESSE) 0.1-20 MG-MCG tablet Take 1 tablet by mouth daily.   Multiple Vitamins-Minerals (MULTIVITAMIN ADULTS PO) Take by mouth.   Probiotic Product (PROBIOTIC PO) Take by mouth daily.   [DISCONTINUED] FLUoxetine (PROZAC) 20 MG capsule Take 20 mg by mouth daily.   [DISCONTINUED] LORazepam (ATIVAN) 0.5 MG tablet Take 0.5 mg by mouth every 8 (eight) hours as needed for anxiety.   [DISCONTINUED] metaxalone (SKELAXIN) 800 MG tablet Take 1 tablet (800 mg total) by mouth 3 (three) times daily.   No facility-administered medications prior to visit.    Review of Systems  HENT:  Negative for hearing loss.   Eyes:  Negative for blurred vision.  Respiratory:  Negative for shortness of breath.   Cardiovascular:  Negative for chest pain.  Gastrointestinal: Negative.   Genitourinary: Negative.   Musculoskeletal:  Negative for back pain.  Neurological:  Negative for headaches.  Psychiatric/Behavioral:  Negative for depression.        Objective:     BP 90/68   Pulse 88   Temp 99.1 F (37.3 C) (Oral)   Ht 5' 8.5" (1.74 m)   Wt 146 lb 14.4 oz (66.6  kg)   SpO2 97%   BMI 22.01 kg/m    Physical Exam Vitals reviewed.  Constitutional:      Appearance: Normal appearance. She is well-groomed and normal weight.  HENT:     Right Ear: Tympanic membrane and ear canal normal.     Left Ear: Tympanic membrane and ear canal normal.     Mouth/Throat:     Mouth: Mucous membranes are moist.     Pharynx: No posterior oropharyngeal erythema.  Eyes:     Conjunctiva/sclera: Conjunctivae normal.  Neck:     Thyroid: No thyromegaly.  Cardiovascular:     Rate and Rhythm: Normal rate and regular rhythm.     Pulses: Normal pulses.     Heart sounds: S1 normal and S2 normal.  Pulmonary:     Effort: Pulmonary effort is normal.     Breath sounds: Normal breath sounds and air entry.   Abdominal:     General: Abdomen is flat. Bowel sounds are normal.     Palpations: Abdomen is soft.  Musculoskeletal:     Right lower leg: No edema.     Left lower leg: No edema.  Lymphadenopathy:     Cervical: No cervical adenopathy.  Neurological:     Mental Status: She is alert and oriented to person, place, and time. Mental status is at baseline.     Gait: Gait is intact.  Psychiatric:        Mood and Affect: Mood and affect normal.        Speech: Speech normal.        Behavior: Behavior normal.        Judgment: Judgment normal.      No results found for any visits on 08/15/23.     Assessment & Plan:    Routine Health Maintenance and Physical Exam  Immunization History  Administered Date(s) Administered   Influenza-Unspecified 05/30/2022   Tdap 08/18/2017    Health Maintenance  Topic Date Due   INFLUENZA VACCINE  03/31/2023   COVID-19 Vaccine (1 - 2024-25 season) Never done   Cervical Cancer Screening (HPV/Pap Cotest)  Never done   DTaP/Tdap/Td (2 - Td or Tdap) 08/19/2027   Hepatitis C Screening  Completed   HIV Screening  Completed   HPV VACCINES  Aged Out    Discussed health benefits of physical activity, and encouraged her to engage in regular exercise appropriate for her age and condition.  Iron deficiency -     Iron, TIBC and Ferritin Panel -     CBC with Differential/Platelet  Routine adult health maintenance -     Comprehensive metabolic panel  Need for immunization against influenza -     Flu vaccine trivalent PF, 6mos and older(Flulaval,Afluria,Fluarix,Fluzone)  Normal physical exam findings today, counseled patient on healthy eating and exercise, handouts given. She needs repeat testing for her iron deficiency, checking iron levels and CBC, CMP for annual surveillance.   Return in 1 year (on 08/14/2024) for annual physical exam.     Karie Georges, MD

## 2023-08-15 NOTE — Telephone Encounter (Signed)
Insurance physical form to be filled out--placed in Dr's folder.  Patient states once complete she would like it uploaded to her MyChart.

## 2023-08-16 LAB — IRON,TIBC AND FERRITIN PANEL
%SAT: 11 % — ABNORMAL LOW (ref 16–45)
Ferritin: 12 ng/mL — ABNORMAL LOW (ref 16–154)
Iron: 47 ug/dL (ref 40–190)
TIBC: 429 ug/dL (ref 250–450)

## 2023-08-16 NOTE — Telephone Encounter (Signed)
Left a detailed message stating PCP completed the form at no charge and this was left at the front desk for pick up as it cannot be uploaded to the Mychart.  Copy sent to be scanned.  Message also left stating PCP received the results from [facility] and she is overdue for a pap smear.  Message left to call back to schedule a pap smear at any time.

## 2023-08-22 ENCOUNTER — Other Ambulatory Visit: Payer: Self-pay | Admitting: Family Medicine

## 2023-08-22 ENCOUNTER — Telehealth: Payer: Self-pay

## 2023-08-22 DIAGNOSIS — Z3041 Encounter for surveillance of contraceptive pills: Secondary | ICD-10-CM

## 2023-08-22 DIAGNOSIS — B009 Herpesviral infection, unspecified: Secondary | ICD-10-CM

## 2023-08-22 MED ORDER — ACYCLOVIR 400 MG PO TABS: 400.0000 mg | ORAL_TABLET | Freq: Two times a day (BID) | ORAL | 1 refills | Status: AC

## 2023-08-22 NOTE — Telephone Encounter (Signed)
Copied from CRM [id number]. Topic: Clinical - Prescription Issue >> Aug 22, 2023  1:10 PM Corin V wrote: Reason for CRM: Patient called in for refill on her  levonorgestrel-ethinyl estradiol (ALESSE) 0.1-20 MG-MCG tablet and acyclovir (ZOVIRAX) 400 MG tablet. CRM resolved instead of routing to clinic. Please reference CRM # [id number] for additional information.

## 2023-08-22 NOTE — Telephone Encounter (Signed)
Rx done. 

## 2023-08-22 NOTE — Telephone Encounter (Signed)
Copied from CRM [id number]. Topic: Clinical - Medication Refill >> Aug 22, 2023  1:07 PM Corin V wrote: Most Recent Primary Care Visit:  Provider: Karie Georges  Department: [facility]  Visit Type: PHYSICAL  Date: 08/15/2023  Medication: levonorgestrel-ethinyl estradiol (ALESSE) 0.1-20 MG-MCG tablet  Has the patient contacted their pharmacy?  (Agent: If no, request that the patient contact the pharmacy for the refill. If patient does not wish to contact the pharmacy document the reason why and proceed with request.) (Agent: If yes, when and what did the pharmacy advise?)  Is this the correct pharmacy for this prescription?  If no, delete pharmacy and type the correct one.  This is the patient's preferred pharmacy:  [facility] [id number] [place], [place] - [address] [place] [address] Phone: [phone] Fax: [phone]   Has the prescription been filled recently?   Is the patient out of the medication?   Has the patient been seen for an appointment in the last year OR does the patient have an upcoming appointment?   Can we respond through MyChart?   Agent: Please be advised that Rx refills may take up to 3 business days. We ask that you follow-up with your pharmacy.

## 2023-08-26 ENCOUNTER — Encounter: Payer: 59 | Admitting: Family Medicine

## 2024-04-09 ENCOUNTER — Other Ambulatory Visit: Payer: Self-pay | Admitting: Family Medicine

## 2024-04-09 DIAGNOSIS — Z3041 Encounter for surveillance of contraceptive pills: Secondary | ICD-10-CM

## 2024-08-16 ENCOUNTER — Encounter: Payer: 59 | Admitting: Family Medicine
